# Patient Record
Sex: Female | Born: 1980 | Race: Black or African American | Hispanic: No | Marital: Married | Smoking: Never smoker
Health system: Southern US, Community
[De-identification: ages and names within clinical notes are randomized; demographics above are authoritative.]

## PROBLEM LIST (undated history)

## (undated) DIAGNOSIS — A6000 Herpesviral infection of urogenital system, unspecified: Secondary | ICD-10-CM

## (undated) DIAGNOSIS — E039 Hypothyroidism, unspecified: Secondary | ICD-10-CM

## (undated) DIAGNOSIS — M329 Systemic lupus erythematosus, unspecified: Secondary | ICD-10-CM

## (undated) HISTORY — DX: Herpesviral infection of urogenital system, unspecified: A60.00

## (undated) HISTORY — DX: Hypothyroidism, unspecified: E03.9

## (undated) HISTORY — PX: PELVIC LAPAROSCOPY: SHX162

## (undated) HISTORY — DX: Systemic lupus erythematosus, unspecified: M32.9

---

## 2003-06-29 ENCOUNTER — Inpatient Hospital Stay (HOSPITAL_COMMUNITY): Admission: AD | Admit: 2003-06-29 | Discharge: 2003-06-29 | Payer: Self-pay | Admitting: Obstetrics and Gynecology

## 2006-01-09 ENCOUNTER — Other Ambulatory Visit: Admission: RE | Admit: 2006-01-09 | Discharge: 2006-01-09 | Payer: Self-pay | Admitting: Obstetrics and Gynecology

## 2007-12-27 HISTORY — PX: THYROIDECTOMY: SHX17

## 2010-11-26 ENCOUNTER — Ambulatory Visit (HOSPITAL_COMMUNITY): Admission: RE | Admit: 2010-11-26 | Payer: Self-pay | Admitting: Obstetrics and Gynecology

## 2011-01-16 ENCOUNTER — Encounter (HOSPITAL_COMMUNITY): Payer: Self-pay | Admitting: Obstetrics and Gynecology

## 2011-11-22 ENCOUNTER — Encounter: Payer: Self-pay | Admitting: Gynecology

## 2011-11-22 ENCOUNTER — Ambulatory Visit (INDEPENDENT_AMBULATORY_CARE_PROVIDER_SITE_OTHER): Payer: BC Managed Care – PPO | Admitting: Gynecology

## 2011-11-22 VITALS — BP 114/76 | Ht 59.5 in | Wt 156.0 lb

## 2011-11-22 DIAGNOSIS — M329 Systemic lupus erythematosus, unspecified: Secondary | ICD-10-CM | POA: Insufficient documentation

## 2011-11-22 DIAGNOSIS — N979 Female infertility, unspecified: Secondary | ICD-10-CM

## 2011-11-22 DIAGNOSIS — E039 Hypothyroidism, unspecified: Secondary | ICD-10-CM | POA: Insufficient documentation

## 2011-11-22 DIAGNOSIS — N803 Endometriosis of pelvic peritoneum: Secondary | ICD-10-CM

## 2011-11-22 NOTE — Progress Notes (Signed)
Patient is a 30 year old gravida 0 who presented to the office with her husband in consultation for primary infertility. Patient stated that she was married for in 3 out of the 5 years she is no form of contraception and did not conceive. No infertility evaluation was done at that time with the exception of a diagnostic laparoscopy as a result of pelvic pain and dyspareunia. She stated that they had diagnosed her with endometriosis and placed on Lupron for 6 months.  Patient has remarried and for the past year has used no contraception and has not conceived. She had used a thermometer in an effort to try to time or intercourse with no success. Her husband has had 2 children with a prior relationship.  Patient also has been diagnosed with lupus and the only medication she is taking his Plaquenil along with prenatal vitamins. She also has informed me that she had a thyroidectomy and it was not for cancer did not receive chemoradiation but is now on Armour thyroid 67.5 mg daily. Dr. Tomma Lightning has been her primary physician.  Patient states her cycles are every 25 days denies any dyspareunia or dysmenorrhea.  Patient weighs 156 pounds at 4 feet 11 inches with a BMI of 30.98 and her blood pressure was 114/76 today.  We will for a detailed discussion of the needed infertility evaluation before we proceed with any form of treatment. We will schedule an HSG shortly after her next menses and also a semen analysis after 72 hours of abstinence and then an appointment to see me the week after. We will try to obtain a copy of the operative note from [facility] or she had a diagnostic laparoscopy with endometriosis was diagnosed. We'll also obtain from Dr. Tomma Lightning her recent labs to assess as well.  Literature information on the diagnosis and management of infertility was provided and we'll further discuss after the above tests are been completed.

## 2011-12-01 ENCOUNTER — Encounter: Payer: Self-pay | Admitting: Gynecology

## 2012-01-18 ENCOUNTER — Telehealth: Payer: Self-pay | Admitting: *Deleted

## 2012-01-18 ENCOUNTER — Other Ambulatory Visit: Payer: Self-pay | Admitting: Gynecology

## 2012-01-18 DIAGNOSIS — N979 Female infertility, unspecified: Secondary | ICD-10-CM

## 2012-01-18 MED ORDER — DOXYCYCLINE HYCLATE 100 MG PO CAPS: ORAL_CAPSULE | ORAL | Status: DC

## 2012-01-18 NOTE — Telephone Encounter (Signed)
Pt called stated that her period started on Sunday. Per office note pt will need HSG, she said that you spoke with her about some medication that she would need to take before HSG? She also wanted to to know if she should continue to take her oxycodone and gabapentin 300 mg,she got this medication when she went to the hospital back in December. Please advise

## 2012-01-18 NOTE — Telephone Encounter (Signed)
Infertility patient recently completed her menstrual period we'll need to be scheduled her HSG at the hospital. For prevention of endometritis she'll be placed on Vibramycin 100 mg twice a day starting with the day before the HSG for 3 days. She should been make an appointment to see me in consultation the week afterwards. We also discussed that we will need a semen analysis from her husband for which Sherrilyn Rist we'll need to coordinate. The other medications that were prescribed to her in the emergency room, the oxycodone and gabapentin, to take only if needed.

## 2012-01-18 NOTE — Telephone Encounter (Signed)
Pt informed with appointment at [facility] hospital for HSG at 01/23/12 @ 8:00 no sex, take medication as directed below, order faxed to women[facility] pt informed with all the below .

## 2012-01-18 NOTE — Telephone Encounter (Signed)
Lm for pt to call

## 2012-01-20 ENCOUNTER — Telehealth: Payer: Self-pay | Admitting: *Deleted

## 2012-01-20 NOTE — Telephone Encounter (Signed)
Message copied by Richardson Chiquito on Fri Jan 20, 2012  9:55 AM ------      Message from: Aura Camps      Created: Thu Jan 19, 2012 12:37 PM       Jennifer Vaughan            Can you please call pt at [phone] regarding S.A. She has HSG appointment on 01/23/12 @ 8:00a.m.            Thanks

## 2012-01-20 NOTE — Telephone Encounter (Signed)
Spoke to Pt regarding scheduling SA for husband. Will fax order and pt willschedule. KW

## 2012-01-23 ENCOUNTER — Encounter: Payer: Self-pay | Admitting: Gynecology

## 2012-01-23 ENCOUNTER — Ambulatory Visit (HOSPITAL_COMMUNITY)
Admission: RE | Admit: 2012-01-23 | Discharge: 2012-01-23 | Disposition: A | Discharge status: Home / Self Care | Payer: BC Managed Care – PPO | Source: Ambulatory Visit | Attending: Gynecology | Admitting: Gynecology

## 2012-01-23 DIAGNOSIS — N809 Endometriosis, unspecified: Secondary | ICD-10-CM | POA: Insufficient documentation

## 2012-01-23 DIAGNOSIS — N979 Female infertility, unspecified: Secondary | ICD-10-CM | POA: Insufficient documentation

## 2012-01-23 MED ORDER — IOHEXOL 300 MG/ML  SOLN
3.0000 mL | Freq: Once | INTRAMUSCULAR | Status: AC | PRN
  Administered 2012-01-23: 3 mL

## 2012-01-26 ENCOUNTER — Encounter: Payer: Self-pay | Admitting: Gynecology

## 2012-01-26 ENCOUNTER — Ambulatory Visit (INDEPENDENT_AMBULATORY_CARE_PROVIDER_SITE_OTHER): Payer: BC Managed Care – PPO | Admitting: Gynecology

## 2012-01-26 VITALS — BP 116/72

## 2012-01-26 DIAGNOSIS — N970 Female infertility associated with anovulation: Secondary | ICD-10-CM

## 2012-01-26 DIAGNOSIS — N915 Oligomenorrhea, unspecified: Secondary | ICD-10-CM

## 2012-01-26 MED ORDER — CLOMIPHENE CITRATE 50 MG PO TABS: ORAL_TABLET | ORAL | Status: DC

## 2012-01-26 NOTE — Progress Notes (Signed)
Patient is a 31 year old gravida 0 who was seen in consultation November 2012 as a result of her primary infertility. Patient several years ago and had a diagnostic laparoscopy and had been diagnosed with endometriosis and had been placed on Lupron for 6 months. She was scheduled for an HSG and semen analysis but her visit was postponed due to the fact the recently she had been in the hospital as a result of a spontaneous pneumothorax and had a chest tube put in. Patient also has history of lupus for which she is on Plaquenil and has history of hypothyroidism and is on Armour thyroid.  HSG recently done demonstrated normal bilateral tubal patency.  Husband semen analysis normal with exception of a slight viscosity  Patient's menstrual cycles fluctuating 25-42 days. We discussed initiating ovulation induction medication for 3 months and timing of intercourse based on response to ovulation predictor kit. If no success she will return back to the office in 3 months and will need to consider then sperm washing and IUI along with ovulation induction. Literature information was provided the risks benefits and pros and cons of ovulation induction medication were discussed to include hyperstimulation and multiple gestation. She will be started on clomiphene citrate 100 mg from day 5 through 9 she will time or intercourse and day 12 through 16 when the ovulation predictor kit and will return to the office on day 20 or 21 her 22 first term progesterone level. She was instructed to continue her prenatal vitamins. All questions are answered and we'll follow accordingly.

## 2012-01-26 NOTE — Patient Instructions (Signed)
Ivah, as per our conversation in the office he her HSG demonstrated that her tubes were open. Her husband semen analysis was normal medial little thick but should not be an issue at this point. We talked about ovulation induction and timing of intercourse with the utilization of the ovulation predictor kit. We discussed the potential risk of ovulation induction medication to include multiple gestation. Ears the protocol double a few to follow over the course of the next 3 months: When her cycle starts the first day of her period is day 1. From day 5 through 9 I would like for you to take 2 tablets in the clomiphene citrate (each tablets 50 mg) then from day 12 today 16 of your cycle use the ovulation predictor kit. If you notice a change this may suture were going to be ovulating in the next 24 hours and this would be the time for you to have intercourse. Either date 20 or 21 or 22 I would like for you to come to the office for a serum progesterone blood tests. If you respond to the above and did not conceive in the first month I have written for refills for you to do it for a total of 3 months. If not successful will need to see you in consultation to consider next that which may be sperm washing with IUI as well. Remember to take her prenatal vitamins daily.

## 2012-03-02 ENCOUNTER — Other Ambulatory Visit: Payer: BC Managed Care – PPO

## 2012-03-02 DIAGNOSIS — N915 Oligomenorrhea, unspecified: Secondary | ICD-10-CM

## 2012-03-03 LAB — PROGESTERONE: Progesterone: 21.3 ng/mL

## 2012-03-13 ENCOUNTER — Telehealth: Payer: Self-pay | Admitting: *Deleted

## 2012-03-13 NOTE — Telephone Encounter (Signed)
Pt called me to say no period yet preg test 3/17 neg at home. D21 prog great result. Advised to wait a couple more days and check another upt and call me by Friday.

## 2012-03-21 ENCOUNTER — Encounter: Payer: Self-pay | Admitting: *Deleted

## 2012-03-21 ENCOUNTER — Encounter: Payer: Self-pay | Admitting: Gynecology

## 2012-03-21 ENCOUNTER — Ambulatory Visit (INDEPENDENT_AMBULATORY_CARE_PROVIDER_SITE_OTHER): Payer: BC Managed Care – PPO | Admitting: Gynecology

## 2012-03-21 DIAGNOSIS — R109 Unspecified abdominal pain: Secondary | ICD-10-CM

## 2012-03-21 NOTE — Progress Notes (Signed)
Patient ID: Jennifer Vaughan. Jennifer Vaughan, female   DOB: 22-Mar-1981, 31 y.o.   MRN: [id number] Pt called left message c/o problems with clomid,left message for pt to make OV .

## 2012-03-21 NOTE — Progress Notes (Signed)
Patient is a 31 year old gravida 0 who presented to the office today stating she was in the emergency room encouraged he'll yesterday complaining of abdominal pain. Review of her records indicate that she has a history of primary infertility and has had a diagnostic laparoscopy whereby she was diagnosed with endometriosis and placed on Lupron for 6 months. As part of her infertility evaluation at another facility she was scheduled to undergo an HSG but had a spontaneous pneumothorax and was hospitalized. She does have a history of lupus for which she is on Plaquenil and has history of hypothyroidism where she is on Armour thyroid. She has been followed by her rheumatologist.   Patient had been started on clomiphene citrate 100 mg from day 5 through 9 for ovulation dysfunction. We had discussed that the Plaquenil needs to be discontinued because of teratogenic risk. She is on day 8 of her cycle and has 2 more days of the clomiphene citrate. She was going to time or intercourse by using ovulation predictor kit and time or intercourse accordingly. It was recommended that she sees her rheumatologist so that he can discontinue her Plaquenil before her ovulation in the event that she were to conceive to minimize any potential risk.  Her abdominal pelvic exam today was essentially unremarkable she is asymptomatic. The symptoms she may have experienced may have been the result of the clomiphene citrate. She had a normal abdominal pelvic CT scan the emergency room yesterday.  We're going to refer the patient to the perinatologist at [facility] hospital to discuss with her her potential high-risk pregnancy that she would be in the future as a result of her lupus and hypothyroidism.

## 2012-03-21 NOTE — Patient Instructions (Signed)
Make sure to follow up with Rheumatologist to discontinue Plaquanil before attempting pregnancy (during ovulation). Will make a consult appointment for you with Perinatologist at [facility] in the next dew weeks.

## 2012-03-26 ENCOUNTER — Ambulatory Visit (INDEPENDENT_AMBULATORY_CARE_PROVIDER_SITE_OTHER): Payer: BC Managed Care – PPO | Admitting: Gynecology

## 2012-03-26 ENCOUNTER — Encounter: Payer: Self-pay | Admitting: Gynecology

## 2012-03-26 VITALS — BP 120/76

## 2012-03-26 DIAGNOSIS — A6004 Herpesviral vulvovaginitis: Secondary | ICD-10-CM

## 2012-03-26 MED ORDER — ACYCLOVIR 5 % EX CREA: 1.0000 "application " | TOPICAL_CREAM | CUTANEOUS | Status: AC

## 2012-03-26 MED ORDER — VALACYCLOVIR HCL 1 G PO TABS: 1000.0000 mg | ORAL_TABLET | Freq: Two times a day (BID) | ORAL | Status: AC

## 2012-03-26 NOTE — Progress Notes (Signed)
Patient presented to the office today the stating that yesterday she began complaining of these ulcer appearing in her external genitalia along with swollen lymph node in the left inguinal region. Patient stated that her husband has oral herpes and a due engaged and oral sex.  Exam: Left inguinal lymphadenopathy External genitalia ruptured like vesicular lesions (3) scattered on the inferior and upper portion of the left labia minora and majora. No vaginal or cervical lesions noted No perineal or perirectal lesions noted  HSV culture was obtained. Because of patient's discomfort in an effort to obtain a culture the base of one of the lesions was infiltrated with 1% lidocaine in effort to allow adequate scraping of the lesion for sampling for the herpes culture.  Assessment/plan: HSV (genitalia) Patient will be placed on Valtrex 1 g twice a day for 10 days and a prescription for 5% acyclovir cream to apply 2-3 times a day for the next 7-10 days. She is to refrain from intercourse during this time. She stated that when she was in the emergency room several weeks ago they did a full workup to include an STD screen which was negative when she had been complaining of abdominal pains. See previous notes.

## 2012-03-26 NOTE — Patient Instructions (Signed)
Herpes Simplex Herpes simplex is generally classified as Type 1 or Type 2. Type 1 is generally the type that is responsible for cold sores. Type 2 is generally associated with sexually transmitted diseases. We now know that most of the thoughts on these viruses are inaccurate. We find that HSV1 is also present genitally and HSV2 can be present orally, but this will vary in different locations of the world. Herpes simplex is usually detected by doing a culture. Blood tests are also available for this virus; however, the accuracy is often not as good.  PREPARATION FOR TEST No preparation or fasting is necessary. NORMAL FINDINGS  No virus present   No HSV antigens or antibodies present  Ranges for normal findings may vary among different laboratories and hospitals. You should always check with your doctor after having lab work or other tests done to discuss the meaning of your test results and whether your values are considered within normal limits. MEANING OF TEST  Your caregiver will go over the test results with you and discuss the importance and meaning of your results, as well as treatment options and the need for additional tests if necessary. OBTAINING THE TEST RESULTS  It is your responsibility to obtain your test results. Ask the lab or department performing the test when and how you will get your results. Document Released: 01/14/2005 Document Revised: 12/01/2011 Document Reviewed: 11/22/2008 [employer] Patient Information 2012 [employer]. 

## 2012-03-29 ENCOUNTER — Encounter: Payer: Self-pay | Admitting: Gynecology

## 2012-03-29 ENCOUNTER — Telehealth: Payer: Self-pay | Admitting: *Deleted

## 2012-03-29 MED ORDER — LIDOCAINE HCL 2 % EX GEL: CUTANEOUS | Status: DC | PRN

## 2012-03-29 NOTE — Telephone Encounter (Signed)
We can call her in 2% lidocaine gel which she can apply in between the times that she is applying the acyclovir cream. I can call her something for pain but it may cause her to be sleepy so she would have to take it while she's at home and no driving. Weekly: Lortab 5.0/500 to take 1 by mouth every 4-6 hours when necessary. This is the narcotic since she's trying to be pregnant she may not want to be on and we can call her and Motrin 800 mg 3 times a day when necessary. For the Lortab we can prescribe 20 tablets and or for the Motrin we can prescribe 30 tablets.

## 2012-03-29 NOTE — Telephone Encounter (Signed)
Message copied by Richardson Chiquito on Thu Mar 29, 2012  2:53 PM ------      Message from: Ok Edwards      Created: Wed Mar 21, 2012  4:09 PM       Amy, this patient needs to be referred to the perinatologist from [facility] who comes to [facility] hospital for consultation for this patient is contemplating getting pregnant who has lupus as well as hypothyroidism. Please send him copy of my last note. Thank you

## 2012-03-29 NOTE — Telephone Encounter (Signed)
Pt was seen on 03/26/12 for vaginal lesions and given acyclovir 5% cream, pt said that she is still having some pain in that area. She would like something else if possible to help relieve the pain. Please advise

## 2012-03-29 NOTE — Telephone Encounter (Signed)
Left message for pt to call.

## 2012-03-29 NOTE — Telephone Encounter (Signed)
Pt informed with the below note, pt will take use the lidocaine gel only. rx sent to pharmacy.

## 2012-03-29 NOTE — Telephone Encounter (Signed)
Apt to perinatologist at [facility] in the Maternal fetal department. April 24 at 930am and arrive by 915. Advised to take records from Lupus dr with her to the appt. I faxed our records and order to [phone] att angela. Pt agreed to go and understood all. Kw

## 2012-04-16 ENCOUNTER — Ambulatory Visit (INDEPENDENT_AMBULATORY_CARE_PROVIDER_SITE_OTHER): Payer: BC Managed Care – PPO | Admitting: Gynecology

## 2012-04-16 ENCOUNTER — Encounter: Payer: Self-pay | Admitting: Gynecology

## 2012-04-16 DIAGNOSIS — N809 Endometriosis, unspecified: Secondary | ICD-10-CM

## 2012-04-16 DIAGNOSIS — N970 Female infertility associated with anovulation: Secondary | ICD-10-CM

## 2012-04-16 DIAGNOSIS — N979 Female infertility, unspecified: Secondary | ICD-10-CM

## 2012-04-16 DIAGNOSIS — N915 Oligomenorrhea, unspecified: Secondary | ICD-10-CM

## 2012-04-16 MED ORDER — CLOMIPHENE CITRATE 50 MG PO TABS: ORAL_TABLET | ORAL | Status: DC

## 2012-04-16 NOTE — Progress Notes (Signed)
Patient is a 31 year old gravida 0 with history of primary infertility who was seen in the office early this year. Patient several years ago had a diagnostic laparoscopy at another facility in [facility] and after reviewing the notes it appears that she had severe endometriosis (stage IV?) obliterating her cul-de-sac both anteriorly and posteriorly and pelvic adhesions. She was then placed on Lupron for 6 months. She eventually had an HSG which demonstrated tubal patency but this had to be delayed as a result of spontaneous pneumothorax that she had experienced. Patient has 2 autoimmune disorders: Lupus and hypothyroidism. 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   Patient is currently on Plaquenil 200 mg daily and Armour thyroid 67.5 mcg daily  Patient states that she's been having normal menstrual cycles. Her partner's semen analysis demonstrated normal count but viscous semen. We did give her a trial of clomiphene citrate 100 mg from day 5 through 9 for which she did notice a change in her ovulation predictor kit and timed  her intercourse accordingly and did not conceive. Serum progesterone level on day 21 was normal with a value of 21.3. Patient was recently treated for primary herpes.  Patient will be referred to maternal fetal medicine specialists because of concerns I have as a result of her history of lupus and being on Plaquenil and potential risk of teratogenicity. We'll do one more cycle of clomiphene citrate and if she does not conceive we are going to refer her to the reproductive endocrinologist for consideration of IVF and possibly banking her eggs for future pregnancy attempts. The risks benefits and pros and cons of all the above discussed with the patient as well as instructions were provided in written format and we'll follow accordingly.

## 2012-04-16 NOTE — Patient Instructions (Signed)
Remember to followup with the perinatologist at [facility] hospital at the end of the month. You can go ahead and start the Clomid today from day 5 through 9. Also use the ovulation predictor kit from day 12 through 16 to time or intercourse. If you do not conceived by the end of the cycle your going to call me and we will set up the appointment with a reproductive endocrinologist for consideration of in vitro fertilization and for banking your eggs for future pregnancies.

## 2012-04-18 ENCOUNTER — Ambulatory Visit (HOSPITAL_COMMUNITY)
Admission: RE | Admit: 2012-04-18 | Discharge: 2012-04-18 | Disposition: A | Discharge status: Home / Self Care | Payer: BC Managed Care – PPO | Source: Ambulatory Visit | Attending: Gynecology | Admitting: Gynecology

## 2012-04-18 ENCOUNTER — Ambulatory Visit (HOSPITAL_COMMUNITY): Payer: BC Managed Care – PPO

## 2012-04-18 DIAGNOSIS — Z3169 Encounter for other general counseling and advice on procreation: Secondary | ICD-10-CM

## 2012-04-18 DIAGNOSIS — E039 Hypothyroidism, unspecified: Secondary | ICD-10-CM

## 2012-04-18 DIAGNOSIS — M329 Systemic lupus erythematosus, unspecified: Secondary | ICD-10-CM

## 2012-04-18 NOTE — Progress Notes (Signed)
MATERNAL FETAL MEDICINE CONSULT  Patient Name: Jennifer Vaughan Medical Record Number:  [id number] Date of Birth: 25-May-1981 Requesting Physician Name:  Ok Edwards, MD Date of Service: 04/18/2012  Chief Complaint Preconceptual consult  History of Present Illness Jennifer Vaughan was seen today for a preconceptual consult for a history of systemic lupus erythematosus (SLE) and hypothyroidism at the request of Ok Edwards, MD.  The patient is a 31 y.o. G0P0 who was first diagnosed with SLE approximately one year ago when she began experiencing joint stiffness and photosensitivity.  She has been well controlled on hydroxychloroquine 200 mg po daily since soon after diagnosis.  She developed bilateral thyroid nodules in 2008 which resulted in airway compromise and required thyroidectomy.  She has been hypothyroid since and is currently taking armor thyroid 67.5 mcg po daily.  She also has a history of childhood asthma that had not caused problems for many years until she developed a spontaneous pneumothorax in December of 2012.  Since that time she has had nightly wheezing that has resulted in her starting Dulera nightly.  She is currently receiving treatment for infertility from Ok Edwards, MD and hopes to conceive in the near future.  Review of Systems Pertinent items are noted in HPI.  Patient History OB History    Grav Para Term Preterm Abortions TAB SAB Ect Mult Living   0               Past Medical History  Diagnosis Date  . Endometriosis   . Lupus   . Hypothyroid   . Herpes genitalia     Past Surgical History  Procedure Date  . Pelvic laparoscopy   . Thyroidectomy 2009    History   Social History  . Marital Status: Married    Spouse Name: N/A    Number of Children: N/A  . Years of Education: N/A   Social History Main Topics  . Smoking status: Never Smoker   . Smokeless tobacco: Never Used  . Alcohol Use: No  . Drug Use: No  . Sexually  Active: Yes -- Female partner(s)   Other Topics Concern  . Not on file   Social History Narrative  . No narrative on file    Family History  Problem Relation Age of Onset  . Heart disease Father     smoker  . Hypertension Maternal Aunt    In addition, the patient's husband had a child from another marriage that is developmentally delayed.  That child does not carry a specific diagnosis, but did experience a difficult delivery.  The family believes his developmental problems are related to the delivery rather than a genetic syndrome.  However, no official genetic work-up has been performed.  Physical Examination Vitals:  BP 109/77, Pulse 86, Weight 143 lbs. General appearance - alert, well appearing, and in no distress  Assessment and Recommendations 1.  Systemic lupus erythematosus.  At this time the patient's SLE appears to be under good control on hydroxychloroquine.  She would be low risk of a disease flare or other lupus related complication in pregnancy.  Thus, this does not represent a contraindication to pregnancy.  Lupus and other autoimmune vasculitides can lead to nephritis and hypertension which can arise for the first time or become exacerbated in pregnancy.  To establish a baseline for the patient a CBC, ANA, anti-dsDNA, C3 complement, and C4 complement should be ordered.  In addition, a 24 hour urine collection and serum creatinine should be  performed to determine her baseline creatinine clearance and proteinuria.  These labs should be repeated each trimester and as clinically indicated based on disease symptoms or the appearance of hypertension.  The presence of SS/A or SS/B atnibodies carries approximately a 5% risk of congenital heart block.  Unfortunately, I do not have records of the laboratory evaluation that confirmed the SLE diagnosis, and cannot determine if she has SS/A or SS/B antibodies.  If these antibodies are present she should be seen once a week after 23 weeks to  assess for fetal bradycardia or other arrhythmia via bedside doppler and if present an obstetrical ultrasound should be obtained.  She will also need a fetal echocardiogram performed at approximately 23 weeks as well.  Finally, as autoimmune diseases are associated with fetal growth restriction the patient should have serial growth scans every 4-6 weeks after her anatomy scan at approximately 18 weeks.  Once or twice weekly fetal surveillance should be started at 32 weeks or sooner if fetal growth restriction is found or if congenital heart block develops. 2.  Hypothyroidism.  Hypothyroidism is not associated with significant pregnancy complications so long as thyroid hormone replacement is adequate.  Hormone requirement typically increase significantly during pregnancy.  A TSH should be checked along with her initial prenatal labs and doses of thyroid placement increased as needed.  TSH should also be rechecked four weeks after every dose change or a minimum of once per trimester.  Thyroid replacement should be titrated to keep the TSH value in the lower half of the normal range. 3.  Asthma.  Given the long absence of her asthma symptoms since childhood, I think her current nocturnal wheezing is a consequence of her recent spontaneous pneumothorax rather than a resurfacing of asthma.  However, if her symptoms persist into pregnancy modification of her asthma medications is warranted.  Dulera (an inhaled corticosteroid) is safe to continue in pregnancy. 4.  Family History of Developmental Delay.  The patient's husband has a child from another marriage that has developmental delay of unknown etiology.  As the child has not received a specific diagnosis it is difficult to assess recurrence risk in the patient's future pregnancies.  If further exploration of this issue is desired the patient should be referred back to the [facility] for genetic counseling.  I spent 30 minutes with Jennifer Vaughan today of which 50% was  face-to-face counseling.   Rema Fendt, MD

## 2012-04-19 ENCOUNTER — Ambulatory Visit (HOSPITAL_COMMUNITY): Payer: BC Managed Care – PPO

## 2012-04-24 ENCOUNTER — Ambulatory Visit (HOSPITAL_COMMUNITY)
Admission: RE | Admit: 2012-04-24 | Discharge: 2012-04-24 | Disposition: A | Discharge status: Home / Self Care | Payer: BC Managed Care – PPO | Source: Ambulatory Visit | Attending: Gynecology | Admitting: Gynecology

## 2012-04-24 DIAGNOSIS — N979 Female infertility, unspecified: Secondary | ICD-10-CM | POA: Insufficient documentation

## 2012-04-25 LAB — SJOGRENS SYNDROME-A EXTRACTABLE NUCLEAR ANTIBODY: SSA (Ro) (ENA) Antibody, IgG: 3 AU/mL (ref <30)

## 2014-01-25 IMAGING — RF DG HYSTEROGRAM
6 series · 6 of 6 positions shown · IV contrast (omnipaque)
Comparison: none

CLINICAL DATA: Endometriosis, infertility

HYSTEROSALPINGOGRAM
TECHNIQUE: Following cleansing of the cervix and vagina with
Betadine solution, a hysterosalpingogram was performed using a 5-
French hysterosalpingogram catheter and Omnipaque 300 contrast.
The patient tolerated the exam without difficulty.
Fluoroscopy time:  0.5 minutes

[Series 1: run · 1 of 1 slices shown (1 of 6)]
[im 1/1]
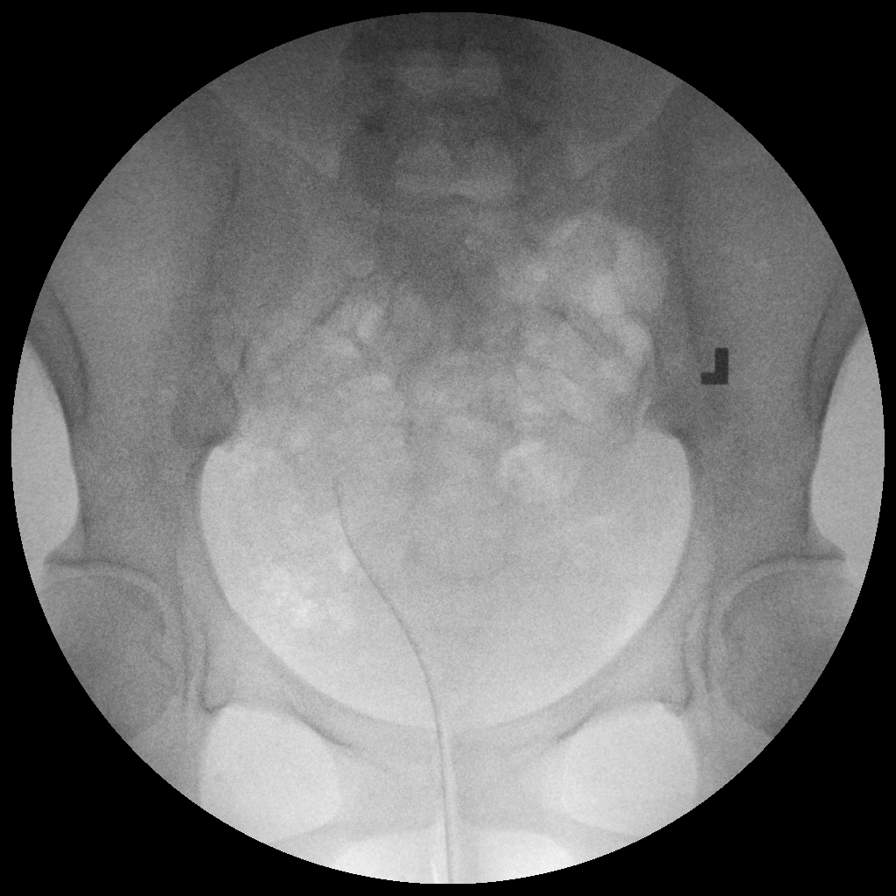

[Series 2: run · 1 of 1 slices shown (2 of 6)]
[im 1/1]
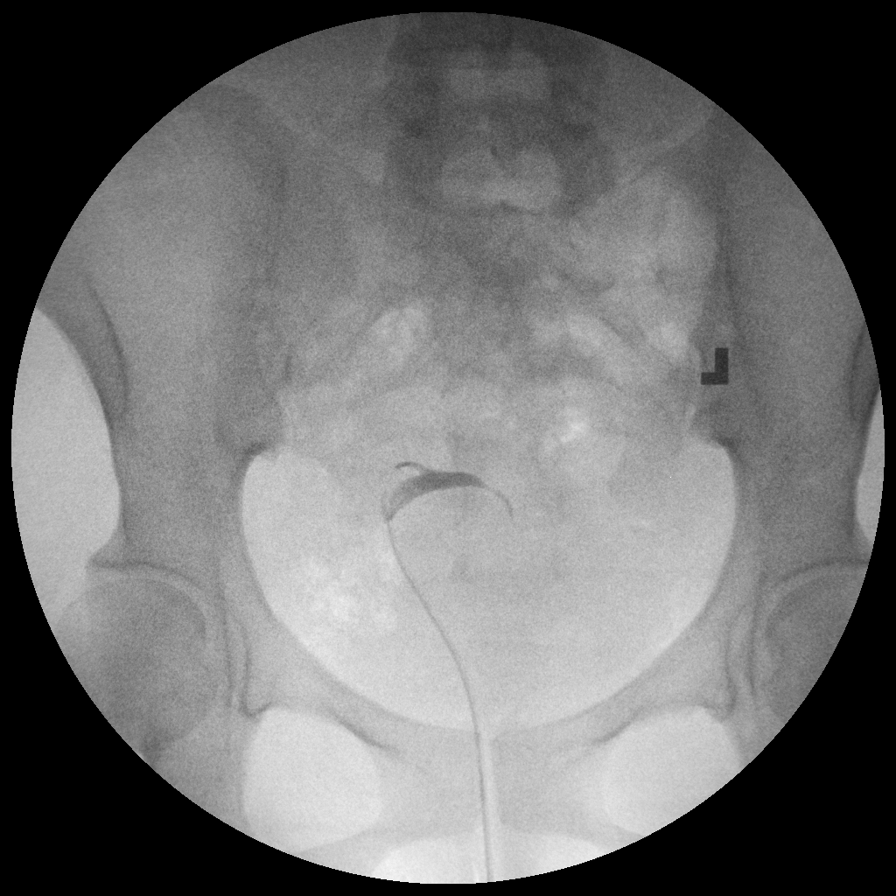

[Series 3: run · 1 of 1 slices shown (3 of 6)]
[im 1/1]
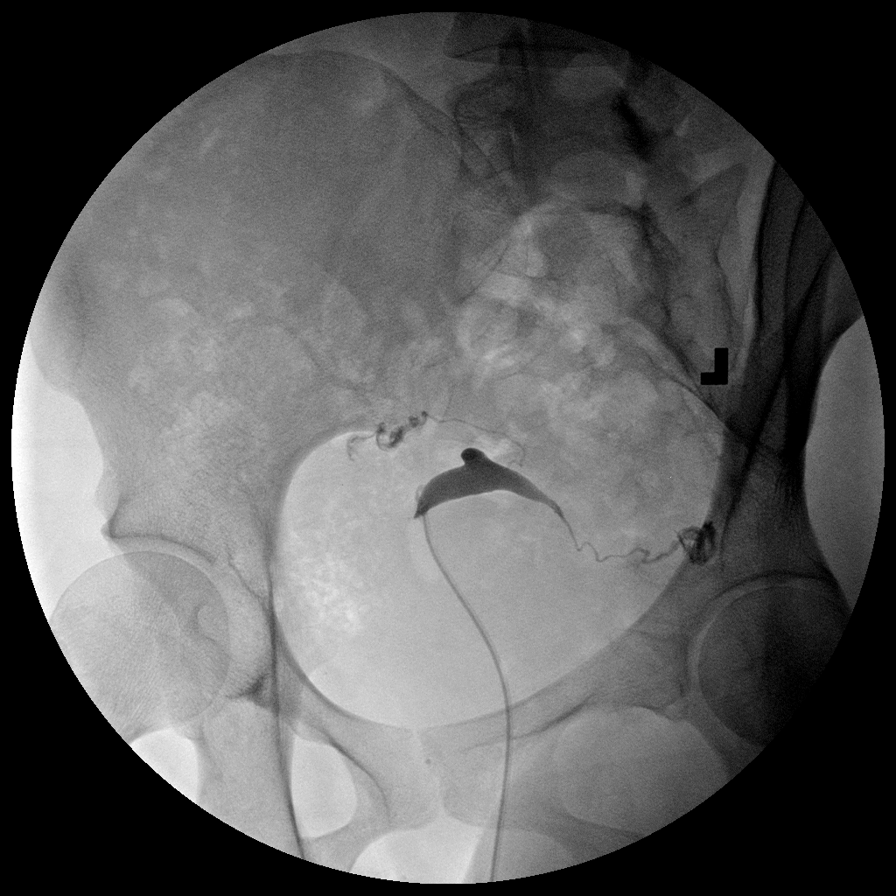

[Series 4: run · 1 of 1 slices shown (4 of 6)]
[im 1/1]
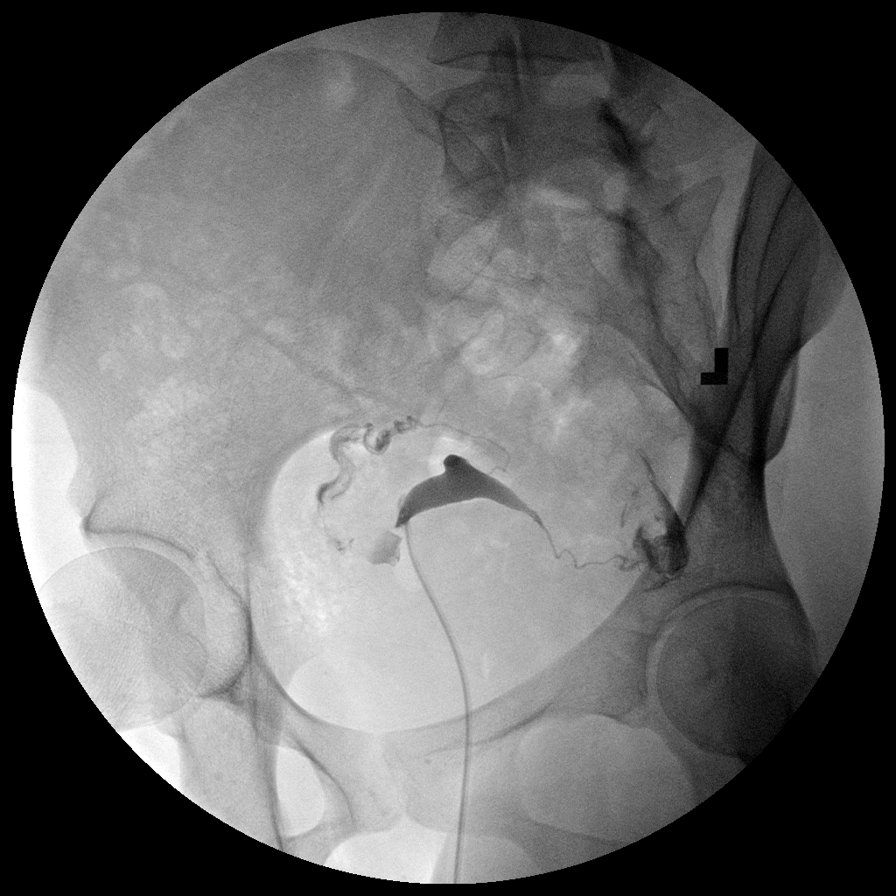

[Series 5: run · 1 of 1 slices shown (5 of 6)]
[im 1/1]
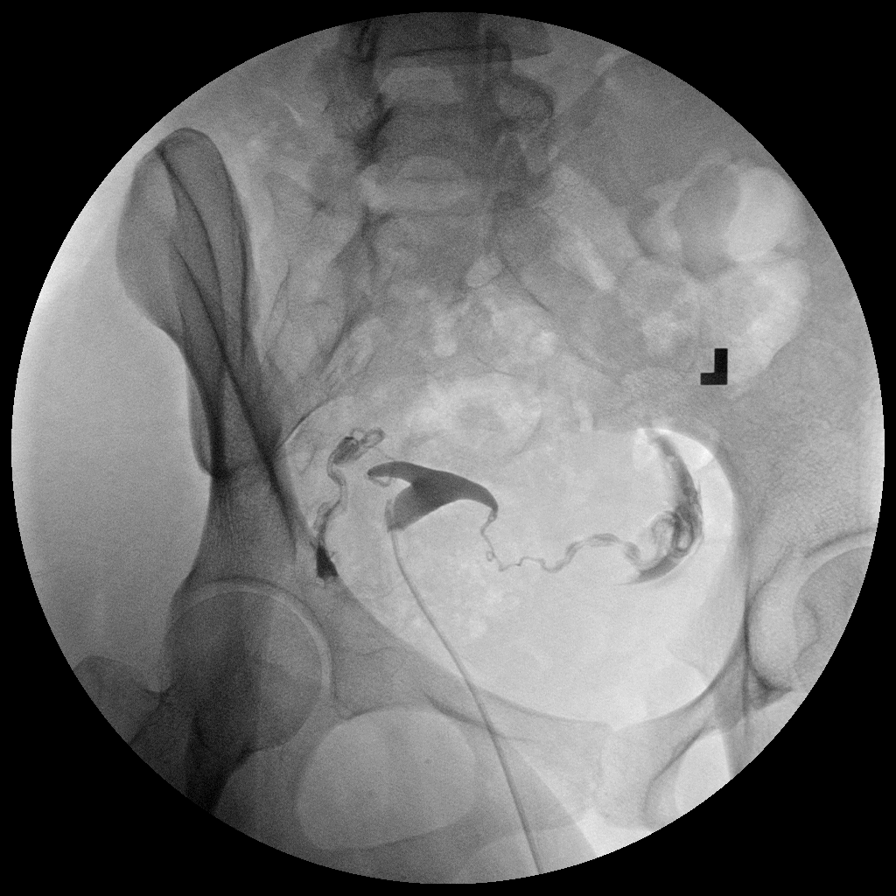

[Series 6: run · 1 of 1 slices shown (6 of 6)]
[im 1/1]
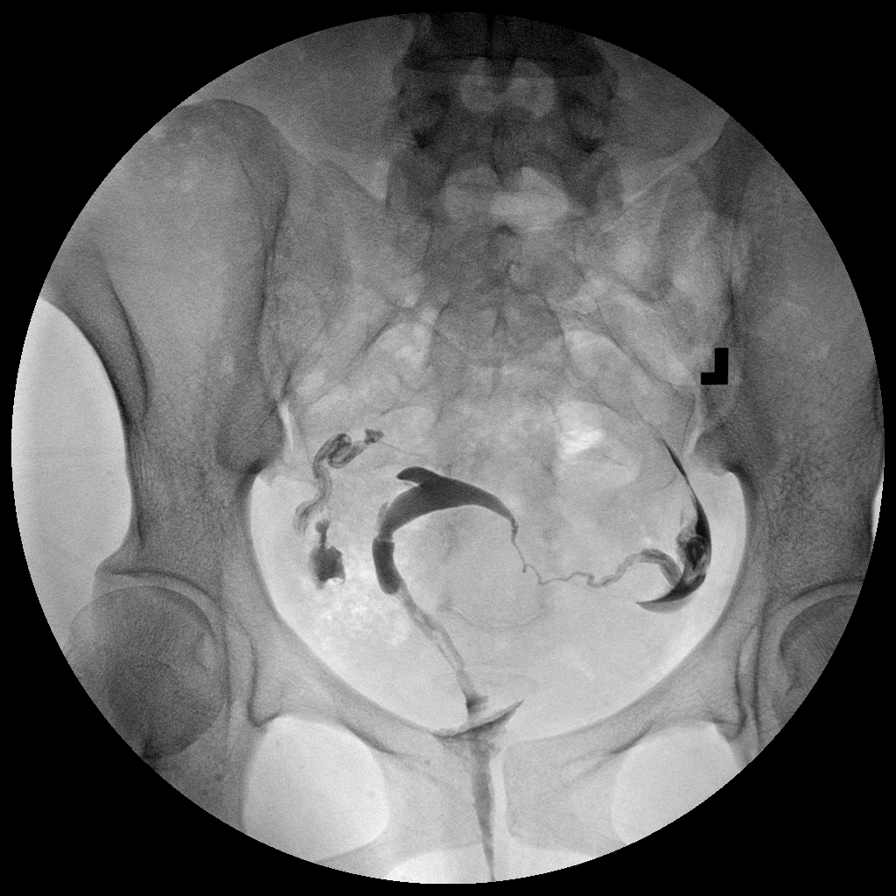

[6 of 6 positions shown; findings below may reference images not displayed]

FINDINGS: The endometrial cavity of the uterus is normal in contour
and appearance.

Contrast filling of both fallopian tubes is seen, and both tubes
are normal in appearance.  Intraperitoneal spill of contrast from
both fallopian tubes is demonstrated.
IMPRESSION: Normal study.  Fallopian tubes are patent bilaterally.

## 2017-05-25 ENCOUNTER — Inpatient Hospital Stay (HOSPITAL_COMMUNITY)
Admission: AD | Admit: 2017-05-25 | Discharge: 2017-05-25 | Disposition: A | Discharge status: Home / Self Care | Payer: BLUE CROSS/BLUE SHIELD | Source: Ambulatory Visit | Attending: Obstetrics and Gynecology | Admitting: Obstetrics and Gynecology

## 2017-05-25 ENCOUNTER — Encounter (HOSPITAL_COMMUNITY): Payer: Self-pay

## 2017-05-25 DIAGNOSIS — K209 Esophagitis, unspecified without bleeding: Secondary | ICD-10-CM

## 2017-05-25 DIAGNOSIS — M329 Systemic lupus erythematosus, unspecified: Secondary | ICD-10-CM | POA: Diagnosis not present

## 2017-05-25 DIAGNOSIS — E039 Hypothyroidism, unspecified: Secondary | ICD-10-CM | POA: Diagnosis not present

## 2017-05-25 DIAGNOSIS — Z79899 Other long term (current) drug therapy: Secondary | ICD-10-CM | POA: Insufficient documentation

## 2017-05-25 LAB — URINALYSIS, ROUTINE W REFLEX MICROSCOPIC
BILIRUBIN URINE: NEGATIVE
GLUCOSE, UA: NEGATIVE mg/dL
HGB URINE DIPSTICK: NEGATIVE
KETONES UR: NEGATIVE mg/dL
Leukocytes, UA: NEGATIVE
NITRITE: NEGATIVE
PH: 7 (ref 5.0–8.0)
Protein, ur: 30 mg/dL — AB
Specific Gravity, Urine: 1.026 (ref 1.005–1.030)
WBC UA: NONE SEEN WBC/hpf (ref 0–5)

## 2017-05-25 LAB — POCT PREGNANCY, URINE: PREG TEST UR: NEGATIVE

## 2017-05-25 MED ORDER — PANTOPRAZOLE SODIUM 40 MG PO TBEC: 40.0000 mg | DELAYED_RELEASE_TABLET | Freq: Every day | ORAL | 1 refills | Status: DC

## 2017-05-25 NOTE — Discharge Instructions (Signed)
Esophagitis Esophagitis is inflammation of the esophagus. The esophagus is the tube that carries food and liquids from your mouth to your stomach. Esophagitis can cause soreness or pain in the esophagus. This condition can make it difficult and painful to swallow. What are the causes? Most causes of esophagitis are not serious. Common causes of this condition include:  Gastroesophageal reflux disease (GERD). This is when stomach contents move back up into the esophagus (reflux).  Repeated vomiting.  An allergic-type reaction, especially caused by food allergies (eosinophilic esophagitis).  Injury to the esophagus by swallowing large pills with or without water, or swallowing certain types of medicines.  Swallowing (ingesting) harmful chemicals, such as household cleaning products.  Heavy alcohol use.  An infection of the esophagus.This most often occurs in people who have a weakened immune system.  Radiation or chemotherapy treatment for cancer.  Certain diseases such as sarcoidosis, Crohn disease, and scleroderma.  What are the signs or symptoms? Symptoms of this condition include:  Difficult or painful swallowing.  Pain with swallowing acidic liquids, such as citrus juices.  Pain with burping.  Chest pain.  Difficulty breathing.  Nausea.  Vomiting.  Pain in the abdomen.  Weight loss.  Ulcers in the mouth.  Patches of white material in the mouth (candidiasis).  Fever.  Coughing up blood or vomiting blood.  Stool that is black, tarry, or bright red.  How is this diagnosed? Your health care provider will take a medical history and perform a physical exam. You may also have other tests, including:  An endoscopy to examine your stomach and esophagus with a small camera.  A test that measures the acidity level in your esophagus.  A test that measures how much pressure is on your esophagus.  A barium swallow or modified barium swallow to show the shape,  size, and functioning of your esophagus.  Allergy tests.  How is this treated? Treatment for this condition depends on the cause of your esophagitis. In some cases, steroids or other medicines may be given to help relieve your symptoms or to treat the underlying cause of your condition. You may have to make some lifestyle changes, such as:  Avoiding alcohol.  Quitting smoking.  Changing your diet.  Exercising.  Changing your sleep habits and your sleep environment.  Follow these instructions at home: Take these actions to decrease your discomfort and to help avoid complications. Diet  Follow a diet as recommended by your health care provider. This may involve avoiding foods and drinks such as: ? Coffee and tea (with or without caffeine). ? Drinks that contain alcohol. ? Energy drinks and sports drinks. ? Carbonated drinks or sodas. ? Chocolate and cocoa. ? Peppermint and mint flavorings. ? Garlic and onions. ? Horseradish. ? Spicy and acidic foods, including peppers, chili powder, curry powder, vinegar, hot sauces, and barbecue sauce. ? Citrus fruit juices and citrus fruits, such as oranges, lemons, and limes. ? Tomato-based foods, such as red sauce, chili, salsa, and pizza with red sauce. ? Fried and fatty foods, such as donuts, french fries, potato chips, and high-fat dressings. ? High-fat meats, such as hot dogs and fatty cuts of red and white meats, such as rib eye steak, sausage, ham, and bacon. ? High-fat dairy items, such as whole milk, butter, and cream cheese.  Eat small, frequent meals instead of large meals.  Avoid drinking large amounts of liquid with your meals.  Avoid eating meals during the 2-3 hours before bedtime.  Avoid lying down right  after you eat.  Do not exercise right after you eat.  Avoid foods and drinks that seem to make your symptoms worse. General instructions  Pay attention to any changes in your symptoms.  Take over-the-counter and  prescription medicines only as told by your health care provider. Do not take aspirin, ibuprofen, or other NSAIDs unless your health care provider told you to do so.  If you have trouble taking pills, use a pill splitter to decrease the size of the pill. This will decrease the chance of the pill getting stuck or injuring your esophagus on the way down. Also, drink water after you take a pill.  Do not use any tobacco products, including cigarettes, chewing tobacco, and e-cigarettes. If you need help quitting, ask your health care provider.  Wear loose-fitting clothing. Do not wear anything tight around your waist that causes pressure on your abdomen.  Raise (elevate) the head of your bed about 6 inches (15 cm).  Try to reduce your stress, such as with yoga or meditation. If you need help reducing stress, ask your health care provider.  If you are overweight, reduce your weight to an amount that is healthy for you. Ask your health care provider for guidance about a safe weight loss goal.  Keep all follow-up visits as told by your health care provider. This is important. Contact a health care provider if:  You have new symptoms.  You have unexplained weight loss.  You have difficulty swallowing, or it hurts to swallow.  You have wheezing or a persistent cough.  Your symptoms do not improve with treatment.  You have frequent heartburn for more than two weeks. Get help right away if:  You have severe pain in your arms, neck, jaw, teeth, or back.  You feel sweaty, dizzy, or light-headed.  You have chest pain or shortness of breath.  You vomit and your vomit looks like blood or coffee grounds.  Your stool is bloody or black.  You have a fever.  You cannot swallow, drink, or eat. This information is not intended to replace advice given to you by your health care provider. Make sure you discuss any questions you have with your health care provider. Document Released: 01/19/2005  Document Revised: 05/19/2016 Document Reviewed: 04/08/2015 Elsevier Interactive Patient Education  2018 [employer]. Esophagitis Esophagitis is inflammation of the esophagus. The esophagus is the tube that carries food and liquids from your mouth to your stomach. Esophagitis can cause soreness or pain in the esophagus. This condition can make it difficult and painful to swallow. What are the causes? Most causes of esophagitis are not serious. Common causes of this condition include:  Gastroesophageal reflux disease (GERD). This is when stomach contents move back up into the esophagus (reflux).  Repeated vomiting.  An allergic-type reaction, especially caused by food allergies (eosinophilic esophagitis).  Injury to the esophagus by swallowing large pills with or without water, or swallowing certain types of medicines.  Swallowing (ingesting) harmful chemicals, such as household cleaning products.  Heavy alcohol use.  An infection of the esophagus.This most often occurs in people who have a weakened immune system.  Radiation or chemotherapy treatment for cancer.  Certain diseases such as sarcoidosis, Crohn disease, and scleroderma.  What are the signs or symptoms? Symptoms of this condition include:  Difficult or painful swallowing.  Pain with swallowing acidic liquids, such as citrus juices.  Pain with burping.  Chest pain.  Difficulty breathing.  Nausea.  Vomiting.  Pain in the abdomen.  Weight loss.  Ulcers in the mouth.  Patches of white material in the mouth (candidiasis).  Fever.  Coughing up blood or vomiting blood.  Stool that is black, tarry, or bright red.  How is this diagnosed? Your health care provider will take a medical history and perform a physical exam. You may also have other tests, including:  An endoscopy to examine your stomach and esophagus with a small camera.  A test that measures the acidity level in your esophagus.  A test  that measures how much pressure is on your esophagus.  A barium swallow or modified barium swallow to show the shape, size, and functioning of your esophagus.  Allergy tests.  How is this treated? Treatment for this condition depends on the cause of your esophagitis. In some cases, steroids or other medicines may be given to help relieve your symptoms or to treat the underlying cause of your condition. You may have to make some lifestyle changes, such as:  Avoiding alcohol.  Quitting smoking.  Changing your diet.  Exercising.  Changing your sleep habits and your sleep environment.  Follow these instructions at home: Take these actions to decrease your discomfort and to help avoid complications. Diet  Follow a diet as recommended by your health care provider. This may involve avoiding foods and drinks such as: ? Coffee and tea (with or without caffeine). ? Drinks that contain alcohol. ? Energy drinks and sports drinks. ? Carbonated drinks or sodas. ? Chocolate and cocoa. ? Peppermint and mint flavorings. ? Garlic and onions. ? Horseradish. ? Spicy and acidic foods, including peppers, chili powder, curry powder, vinegar, hot sauces, and barbecue sauce. ? Citrus fruit juices and citrus fruits, such as oranges, lemons, and limes. ? Tomato-based foods, such as red sauce, chili, salsa, and pizza with red sauce. ? Fried and fatty foods, such as donuts, french fries, potato chips, and high-fat dressings. ? High-fat meats, such as hot dogs and fatty cuts of red and white meats, such as rib eye steak, sausage, ham, and bacon. ? High-fat dairy items, such as whole milk, butter, and cream cheese.  Eat small, frequent meals instead of large meals.  Avoid drinking large amounts of liquid with your meals.  Avoid eating meals during the 2-3 hours before bedtime.  Avoid lying down right after you eat.  Do not exercise right after you eat.  Avoid foods and drinks that seem to make your  symptoms worse. General instructions  Pay attention to any changes in your symptoms.  Take over-the-counter and prescription medicines only as told by your health care provider. Do not take aspirin, ibuprofen, or other NSAIDs unless your health care provider told you to do so.  If you have trouble taking pills, use a pill splitter to decrease the size of the pill. This will decrease the chance of the pill getting stuck or injuring your esophagus on the way down. Also, drink water after you take a pill.  Do not use any tobacco products, including cigarettes, chewing tobacco, and e-cigarettes. If you need help quitting, ask your health care provider.  Wear loose-fitting clothing. Do not wear anything tight around your waist that causes pressure on your abdomen.  Raise (elevate) the head of your bed about 6 inches (15 cm).  Try to reduce your stress, such as with yoga or meditation. If you need help reducing stress, ask your health care provider.  If you are overweight, reduce your weight to an amount that is healthy for you.  Ask your health care provider for guidance about a safe weight loss goal.  Keep all follow-up visits as told by your health care provider. This is important. Contact a health care provider if:  You have new symptoms.  You have unexplained weight loss.  You have difficulty swallowing, or it hurts to swallow.  You have wheezing or a persistent cough.  Your symptoms do not improve with treatment.  You have frequent heartburn for more than two weeks. Get help right away if:  You have severe pain in your arms, neck, jaw, teeth, or back.  You feel sweaty, dizzy, or light-headed.  You have chest pain or shortness of breath.  You vomit and your vomit looks like blood or coffee grounds.  Your stool is bloody or black.  You have a fever.  You cannot swallow, drink, or eat. This information is not intended to replace advice given to you by your health care  provider. Make sure you discuss any questions you have with your health care provider. Document Released: 01/19/2005 Document Revised: 05/19/2016 Document Reviewed: 04/08/2015 Elsevier Interactive Patient Education  [employer]2018 Else[employer] Inc[employer]

## 2017-05-25 NOTE — Progress Notes (Addendum)
endometrosis and had endo ablasion procdure done on april 26 with balloon placement. Took balloon out on May 29. Presents to triage for vaginal bruising. Denies bleeding. Pain level 4/10. Did not take meds but took meds after procedure for past month.   Provider at [facility] assessing pt.   1445: Discharge instructions given with pt understanding. Pt left unit via ambulatory.

## 2017-05-25 NOTE — MAU Provider Note (Signed)
History     CSN: [id number]  Arrival date and time: 05/25/17 1330   First Provider Initiated Contact with Patient 05/25/17 1358      No chief complaint on file.  Jennifer Vaughan is a 36 y.o. G0P0 with concern of abdominal bruising first noted yesterday. 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 The balloon was removed 2 days ago. Prior to that 2 weeks before it was partially drained due to her abdominal pain. For about a month she's been taking for Aleve tablets 4 times a day but did stop this 2 days ago. She was also taking 4 Tylenols every 2 hours. The initial postoperative period LMP was normal on 05/17/2017. She is also complaining of severe throat and stomach burning sensation exacerbated by eating. She states she does not eat at all until 6 PM due to the pain. She tried Pepto-Bismol in fairly large doses without benefit and also tried Zantac which was ineffective. Significant comorbidities are lupus and hypothyroid.      GYN Hx : Endometriosis with uterine surgery 3 years ago and endometrial curretage in [place] 1 month ago. 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  Past Medical History:  Diagnosis Date  . Endometriosis   . Herpes genitalia   . Hypothyroid   . Lupus     Past Surgical History:  Procedure Laterality Date  . PELVIC LAPAROSCOPY    . THYROIDECTOMY  2009    Family History  Problem Relation Age of Onset  . Heart disease Father        smoker  . Hypertension Maternal Aunt     Social History  Substance Use Topics  . Smoking status: Never Smoker  . Smokeless tobacco: Never Used  . Alcohol use No    Allergies:  Allergies  Allergen Reactions  . Other Swelling    shellfish    Prescriptions Prior to Admission  Medication Sig Dispense Refill Last Dose  . clomiPHENE (CLOMID) 50 MG tablet Clomiphene citrate 50 mg patient to take 2 tablets  daily from day 5 through 9 of cycle. 10 tablet 2   . ferrous sulfate 325 (65 FE) MG tablet Take 325 mg by mouth daily with breakfast.   Taking  . gabapentin (NEURONTIN) 100 MG capsule Take 100 mg by mouth 2 (two) times daily.   Not Taking  . hydroxychloroquine (PLAQUENIL) 200 MG tablet Take by mouth daily.     Taking  . oxyCODONE (OXYCONTIN) 10 MG 12 hr tablet Take 10 mg by mouth every 12 (twelve) hours.   Not Taking  . Prenat-FeFum-FePo-FA-Omega 3 (SE-TAN DHA) 15-15-1 MG CAPS Take by mouth.     Taking  . Prenatal Vit-Fe Psac Cmplx-FA (PRENATAL MULTIVITAMIN) 60-1 MG tablet Take 1 tablet by mouth daily with breakfast.     Taking  . thyroid (ARMOUR) 15 MG tablet Take 15 mg by mouth daily. HALF A PILL A DAY   Taking  . thyroid (ARMOUR) 60 MG tablet Take 60 mg by mouth daily.   Taking    Review of Systems  Constitutional: Positive for appetite change and fatigue. Negative for fever.  Respiratory: Negative for chest tightness.   Gastrointestinal: Positive for abdominal pain.  Genitourinary: Negative for pelvic pain, vaginal bleeding, vaginal discharge and vaginal pain.  Neurological: Positive for dizziness. Negative for weakness and light-headedness.  Psychiatric/Behavioral: Negative for agitation. The patient is not nervous/anxious.    Physical Exam  There were no vitals taken for this visit.  Physical Exam  Nursing note and vitals reviewed. Constitutional: She is oriented to person, place, and time. She appears well-developed and well-nourished. No distress.  HENT:  Head: Normocephalic.  Eyes: Pupils are equal, round, and reactive to light.  Neck: Normal range of motion.  Cardiovascular: Normal rate.   Respiratory: Effort normal.  GI: Soft. She exhibits no mass. There is no tenderness. There is no rebound and no guarding.  Faint diffuse ecchymosis of abdomen on photo on her phone, barely discernible now on exam  Genitourinary:  Genitourinary Comments: Pelvic deferred   Musculoskeletal: Normal range of motion.  Neurological: She is alert and oriented to person, place, and time.  Skin: Skin is warm and dry. No erythema.  Psychiatric: She has a normal mood and affect. Her behavior is normal.    MAU Course  Procedures Results for orders placed or performed during the hospital encounter of 05/25/17 (from the past 24 hour(s))  Urinalysis, Routine w reflex microscopic     Status: Abnormal   Collection Time: 05/25/17  1:44 PM  Result Value Ref Range   Color, Urine YELLOW YELLOW   APPearance CLEAR CLEAR   Specific Gravity, Urine 1.026 1.005 - 1.030   pH 7.0 5.0 - 8.0   Glucose, UA NEGATIVE NEGATIVE mg/dL   Hgb urine dipstick NEGATIVE NEGATIVE   Bilirubin Urine NEGATIVE NEGATIVE   Ketones, ur NEGATIVE NEGATIVE mg/dL   Protein, ur 30 (A) NEGATIVE mg/dL   Nitrite NEGATIVE NEGATIVE   Leukocytes, UA NEGATIVE NEGATIVE   RBC / HPF 0-5 0 - 5 RBC/hpf   WBC, UA NONE SEEN 0 - 5 WBC/hpf   Bacteria, UA RARE (A) NONE SEEN   Squamous Epithelial / LPF 0-5 (A) NONE SEEN   Mucous PRESENT    Triple Phosphate Crystal PRESENT   Pregnancy, urine POC     Status: None   Collection Time: 05/25/17  1:50 PM  Result Value Ref Range   Preg Test, Ur NEGATIVE NEGATIVE    Assessment and Plan   1. Acute esophagitis     S/P endometrial curettage and balloon removal   Reassured re resolving skin ecchymosis Allergies as of 05/25/2017      Reactions   Other Swelling   shellfish      Medication List    STOP taking these medications   clomiPHENE 50 MG tablet Commonly known as:  CLOMID     TAKE these medications   albuterol 108 (90 Base) MCG/ACT inhaler Commonly known as:  PROVENTIL HFA;VENTOLIN HFA Inhale into the lungs.   ferrous sulfate 325 (65 FE) MG tablet Take 325 mg by mouth daily with breakfast.   hydroxychloroquine 200 MG tablet Commonly known as:  PLAQUENIL Take by mouth daily.   levothyroxine 150 MCG tablet Commonly known as:  SYNTHROID,  LEVOTHROID Take 150 mcg by mouth daily before breakfast.   pantoprazole 40 MG tablet Commonly known as:  PROTONIX Take 1 tablet (40 mg total) by mouth daily.   SE-TAN DHA 15-15-1 MG Caps Take by mouth.      Follow-up Information    Tomma Lightning, MD Follow up.   Specialty:  Family Medicine Contact information: [address] [facility] [place] [address] [phone]         Keep F/U appointment with OB/GYN in [place], [place] in 1 month  Keiona Jenison CNM 05/25/2017, 2:00 PM

## 2017-07-19 ENCOUNTER — Other Ambulatory Visit: Payer: Self-pay | Admitting: Obstetrics and Gynecology
